# Patient Record
Sex: Female | Born: 1980 | Race: White | Hispanic: No | Marital: Married | Smoking: Never smoker
Health system: Southern US, Community
[De-identification: ages and names within clinical notes are randomized; demographics above are authoritative.]

## PROBLEM LIST (undated history)

## (undated) DIAGNOSIS — F329 Major depressive disorder, single episode, unspecified: Secondary | ICD-10-CM

## (undated) DIAGNOSIS — I499 Cardiac arrhythmia, unspecified: Secondary | ICD-10-CM

## (undated) DIAGNOSIS — F32A Depression, unspecified: Secondary | ICD-10-CM

## (undated) HISTORY — PX: WISDOM TOOTH EXTRACTION: SHX21

---

## 2003-02-19 ENCOUNTER — Emergency Department (HOSPITAL_COMMUNITY): Admission: EM | Admit: 2003-02-19 | Discharge: 2003-02-19 | Payer: Self-pay

## 2003-06-27 ENCOUNTER — Other Ambulatory Visit: Admission: RE | Admit: 2003-06-27 | Discharge: 2003-06-27 | Payer: Self-pay | Admitting: *Deleted

## 2004-06-29 ENCOUNTER — Other Ambulatory Visit: Admission: RE | Admit: 2004-06-29 | Discharge: 2004-06-29 | Payer: Self-pay | Admitting: *Deleted

## 2004-07-02 ENCOUNTER — Encounter: Admission: RE | Admit: 2004-07-02 | Discharge: 2004-07-02 | Payer: Self-pay | Admitting: Family Medicine

## 2005-07-13 ENCOUNTER — Other Ambulatory Visit: Admission: RE | Admit: 2005-07-13 | Discharge: 2005-07-13 | Payer: Self-pay | Admitting: *Deleted

## 2006-07-20 ENCOUNTER — Other Ambulatory Visit: Admission: RE | Admit: 2006-07-20 | Discharge: 2006-07-20 | Payer: Self-pay | Admitting: *Deleted

## 2007-08-17 ENCOUNTER — Other Ambulatory Visit: Admission: RE | Admit: 2007-08-17 | Discharge: 2007-08-17 | Payer: Self-pay | Admitting: Family Medicine

## 2008-08-19 ENCOUNTER — Other Ambulatory Visit: Admission: RE | Admit: 2008-08-19 | Discharge: 2008-08-19 | Payer: Self-pay | Admitting: Family Medicine

## 2009-09-03 ENCOUNTER — Other Ambulatory Visit: Admission: RE | Admit: 2009-09-03 | Discharge: 2009-09-03 | Payer: Self-pay | Admitting: Family Medicine

## 2010-12-03 ENCOUNTER — Other Ambulatory Visit: Payer: Self-pay | Admitting: Family Medicine

## 2010-12-03 DIAGNOSIS — R110 Nausea: Secondary | ICD-10-CM

## 2010-12-07 ENCOUNTER — Ambulatory Visit
Admission: RE | Admit: 2010-12-07 | Discharge: 2010-12-07 | Disposition: A | Discharge status: Home / Self Care | Payer: Commercial Managed Care - PPO | Source: Ambulatory Visit | Attending: Family Medicine | Admitting: Family Medicine

## 2010-12-07 DIAGNOSIS — R110 Nausea: Secondary | ICD-10-CM

## 2010-12-07 DIAGNOSIS — R7989 Other specified abnormal findings of blood chemistry: Secondary | ICD-10-CM

## 2010-12-17 ENCOUNTER — Other Ambulatory Visit (HOSPITAL_COMMUNITY): Payer: Self-pay | Admitting: Gastroenterology

## 2010-12-17 DIAGNOSIS — R110 Nausea: Secondary | ICD-10-CM

## 2010-12-22 ENCOUNTER — Encounter (HOSPITAL_COMMUNITY)
Admission: RE | Admit: 2010-12-22 | Discharge: 2010-12-22 | Disposition: A | Discharge status: Home / Self Care | Payer: Commercial Managed Care - PPO | Source: Ambulatory Visit | Attending: Gastroenterology | Admitting: Gastroenterology

## 2010-12-22 DIAGNOSIS — R110 Nausea: Secondary | ICD-10-CM | POA: Insufficient documentation

## 2010-12-22 MED ORDER — TECHNETIUM TC 99M MEBROFENIN IV KIT
5.0000 | PACK | Freq: Once | INTRAVENOUS | Status: AC | PRN
  Administered 2010-12-22: 5 via INTRAVENOUS

## 2010-12-29 ENCOUNTER — Other Ambulatory Visit (HOSPITAL_COMMUNITY): Payer: Commercial Managed Care - PPO

## 2011-06-08 NOTE — L&D Delivery Note (Signed)
Delivery Note At 7:23 PM a viable female was delivered via  (Presentation: ROA;  ).  APGAR:8/9 , ; weight .pending   Placenta status: intact, .3 vessel  Cord:  with the following complications:none .  Cord pH: na  Anesthesia:epidural   Episiotomy: none Lacerations: second degree Suture Repair: 2.0 chromic Est. Blood Loss (mL): 400 cc  Mom to postpartum.  Baby to nursery-stable.  Alastor Kneale S 05/16/2012, 7:39 PM

## 2011-10-04 LAB — OB RESULTS CONSOLE GC/CHLAMYDIA: Chlamydia: NEGATIVE

## 2011-10-04 LAB — OB RESULTS CONSOLE ANTIBODY SCREEN: Antibody Screen: NEGATIVE

## 2012-04-12 LAB — OB RESULTS CONSOLE GBS: GBS: NEGATIVE

## 2012-05-15 ENCOUNTER — Encounter (HOSPITAL_COMMUNITY): Payer: Self-pay | Admitting: *Deleted

## 2012-05-15 ENCOUNTER — Inpatient Hospital Stay (HOSPITAL_COMMUNITY)
Admission: AD | Admit: 2012-05-15 | Discharge: 2012-05-15 | Disposition: A | Discharge status: Home / Self Care | Payer: Commercial Managed Care - PPO | Source: Ambulatory Visit | Attending: Obstetrics and Gynecology | Admitting: Obstetrics and Gynecology

## 2012-05-15 DIAGNOSIS — O479 False labor, unspecified: Secondary | ICD-10-CM | POA: Insufficient documentation

## 2012-05-15 HISTORY — DX: Depression, unspecified: F32.A

## 2012-05-15 HISTORY — DX: Cardiac arrhythmia, unspecified: I49.9

## 2012-05-15 HISTORY — DX: Major depressive disorder, single episode, unspecified: F32.9

## 2012-05-15 LAB — COMPREHENSIVE METABOLIC PANEL
ALT: 11 U/L (ref 0–35)
AST: 19 U/L (ref 0–37)
Albumin: 3 g/dL — ABNORMAL LOW (ref 3.5–5.2)
Alkaline Phosphatase: 167 U/L — ABNORMAL HIGH (ref 39–117)
GFR calc Af Amer: >90 mL/min (ref >90)
Glucose, Bld: 91 mg/dL (ref 70–99)
Potassium: 4.2 mEq/L (ref 3.5–5.1)
Sodium: 135 mEq/L (ref 135–145)
Total Protein: 7 g/dL (ref 6.0–8.3)

## 2012-05-15 LAB — URINALYSIS, ROUTINE W REFLEX MICROSCOPIC
Ketones, ur: NEGATIVE mg/dL
Nitrite: NEGATIVE
Specific Gravity, Urine: <1.005 — ABNORMAL LOW (ref 1.005–1.030)
pH: 6 (ref 5.0–8.0)

## 2012-05-15 LAB — URINE MICROSCOPIC-ADD ON

## 2012-05-15 LAB — CBC
Hemoglobin: 12.1 g/dL (ref 12.0–15.0)
MCHC: 33.9 g/dL (ref 30.0–36.0)

## 2012-05-15 NOTE — MAU Note (Signed)
Contractions closer together

## 2012-05-15 NOTE — MAU Note (Signed)
uc's since 0200, becoming more intense & frequent, denies bleeding or LOF.

## 2012-05-15 NOTE — MAU Note (Signed)
Pt back from walking. To BR and then will reck cervix

## 2012-05-15 NOTE — Progress Notes (Signed)
Pt up to walk.

## 2012-05-15 NOTE — Progress Notes (Signed)
Pt very tense when breathing thru ctxs.. Encouraged to breathe and relax with ctxs.

## 2012-05-15 NOTE — Progress Notes (Signed)
Written and verbal d/c instructions given and understanding voiced. 

## 2012-05-15 NOTE — Progress Notes (Signed)
Dr Marcelle Overlie notified of pt's lab results and unchanged sve. Aware CNM saw pt and reviewed labs and efm. Pt stable for dc home

## 2012-05-15 NOTE — Progress Notes (Signed)
Dr Marcelle Overlie notified of pt's admission and status. Aware of ctx pattern, sve, elevated B/P. Orders received.

## 2012-05-16 ENCOUNTER — Encounter (HOSPITAL_COMMUNITY): Payer: Self-pay | Admitting: Anesthesiology

## 2012-05-16 ENCOUNTER — Inpatient Hospital Stay (HOSPITAL_COMMUNITY): Payer: Commercial Managed Care - PPO | Admitting: Anesthesiology

## 2012-05-16 ENCOUNTER — Encounter (HOSPITAL_COMMUNITY): Payer: Self-pay | Admitting: Obstetrics

## 2012-05-16 ENCOUNTER — Inpatient Hospital Stay (HOSPITAL_COMMUNITY)
Admission: AD | Admit: 2012-05-16 | Discharge: 2012-05-18 | DRG: 775 | Disposition: A | Discharge status: Home / Self Care | Payer: Commercial Managed Care - PPO | Source: Ambulatory Visit | Attending: Obstetrics and Gynecology | Admitting: Obstetrics and Gynecology

## 2012-05-16 LAB — CBC
HCT: 35 % — ABNORMAL LOW (ref 36.0–46.0)
Hemoglobin: 11.7 g/dL — ABNORMAL LOW (ref 12.0–15.0)
MCH: 31.5 pg (ref 26.0–34.0)
MCHC: 33.4 g/dL (ref 30.0–36.0)
MCV: 94.1 fL (ref 78.0–100.0)
RDW: 13.1 % (ref 11.5–15.5)

## 2012-05-16 LAB — POCT FERN TEST: POCT Fern Test: NEGATIVE

## 2012-05-16 MED ORDER — PHENYLEPHRINE 40 MCG/ML (10ML) SYRINGE FOR IV PUSH (FOR BLOOD PRESSURE SUPPORT)
80.0000 ug | PREFILLED_SYRINGE | INTRAVENOUS | Status: DC | PRN
  Filled 2012-05-16: qty 5

## 2012-05-16 MED ORDER — LIDOCAINE HCL (PF) 1 % IJ SOLN
30.0000 mL | INTRAMUSCULAR | Status: DC | PRN
  Administered 2012-05-16: 30 mL via SUBCUTANEOUS
  Filled 2012-05-16: qty 30

## 2012-05-16 MED ORDER — OXYTOCIN 40 UNITS IN LACTATED RINGERS INFUSION - SIMPLE MED: 62.5000 mL/h | INTRAVENOUS | Status: DC

## 2012-05-16 MED ORDER — OXYTOCIN 40 UNITS IN LACTATED RINGERS INFUSION - SIMPLE MED
1.0000 m[IU]/min | INTRAVENOUS | Status: DC
  Administered 2012-05-16: 2 m[IU]/min via INTRAVENOUS
  Filled 2012-05-16: qty 1000

## 2012-05-16 MED ORDER — FENTANYL 2.5 MCG/ML BUPIVACAINE 1/10 % EPIDURAL INFUSION (WH - ANES)
INTRAMUSCULAR | Status: DC | PRN
  Administered 2012-05-16: 14 mL/h via EPIDURAL

## 2012-05-16 MED ORDER — DIPHENHYDRAMINE HCL 50 MG/ML IJ SOLN: 12.5000 mg | INTRAMUSCULAR | Status: DC | PRN

## 2012-05-16 MED ORDER — SENNOSIDES-DOCUSATE SODIUM 8.6-50 MG PO TABS
2.0000 | ORAL_TABLET | Freq: Every day | ORAL | Status: DC
  Administered 2012-05-16 – 2012-05-17 (×2): 2 via ORAL

## 2012-05-16 MED ORDER — TETANUS-DIPHTH-ACELL PERTUSSIS 5-2.5-18.5 LF-MCG/0.5 IM SUSP: 0.5000 mL | Freq: Once | INTRAMUSCULAR | Status: DC

## 2012-05-16 MED ORDER — ACETAMINOPHEN 325 MG PO TABS: 650.0000 mg | ORAL_TABLET | ORAL | Status: DC | PRN

## 2012-05-16 MED ORDER — TERBUTALINE SULFATE 1 MG/ML IJ SOLN: 0.2500 mg | Freq: Once | INTRAMUSCULAR | Status: DC | PRN

## 2012-05-16 MED ORDER — PRENATAL MULTIVITAMIN CH
1.0000 | ORAL_TABLET | Freq: Every day | ORAL | Status: DC
  Administered 2012-05-17 – 2012-05-18 (×2): 1 via ORAL
  Filled 2012-05-16 (×2): qty 1

## 2012-05-16 MED ORDER — LACTATED RINGERS IV SOLN: 500.0000 mL | Freq: Once | INTRAVENOUS | Status: DC

## 2012-05-16 MED ORDER — BENZOCAINE-MENTHOL 20-0.5 % EX AERO
1.0000 "application " | INHALATION_SPRAY | CUTANEOUS | Status: DC | PRN
  Filled 2012-05-16: qty 56

## 2012-05-16 MED ORDER — OXYCODONE-ACETAMINOPHEN 5-325 MG PO TABS
1.0000 | ORAL_TABLET | ORAL | Status: DC | PRN
  Administered 2012-05-17: 2 via ORAL
  Administered 2012-05-17 (×2): 1 via ORAL
  Administered 2012-05-18: 2 via ORAL
  Filled 2012-05-16 (×2): qty 1
  Filled 2012-05-16 (×2): qty 2

## 2012-05-16 MED ORDER — WITCH HAZEL-GLYCERIN EX PADS: 1.0000 "application " | MEDICATED_PAD | CUTANEOUS | Status: DC | PRN

## 2012-05-16 MED ORDER — OXYTOCIN BOLUS FROM INFUSION
500.0000 mL | INTRAVENOUS | Status: DC
  Administered 2012-05-16: 500 mL via INTRAVENOUS

## 2012-05-16 MED ORDER — OXYCODONE-ACETAMINOPHEN 5-325 MG PO TABS: 1.0000 | ORAL_TABLET | ORAL | Status: DC | PRN

## 2012-05-16 MED ORDER — ONDANSETRON HCL 4 MG/2ML IJ SOLN: 4.0000 mg | Freq: Four times a day (QID) | INTRAMUSCULAR | Status: DC | PRN

## 2012-05-16 MED ORDER — LIDOCAINE HCL (PF) 1 % IJ SOLN
INTRAMUSCULAR | Status: DC | PRN
  Administered 2012-05-16 (×2): 4 mL

## 2012-05-16 MED ORDER — LACTATED RINGERS IV SOLN
INTRAVENOUS | Status: DC
  Administered 2012-05-16: 17:00:00 via INTRAVENOUS

## 2012-05-16 MED ORDER — IBUPROFEN 600 MG PO TABS
600.0000 mg | ORAL_TABLET | Freq: Four times a day (QID) | ORAL | Status: DC | PRN
  Administered 2012-05-16: 600 mg via ORAL
  Filled 2012-05-16: qty 1

## 2012-05-16 MED ORDER — LACTATED RINGERS IV SOLN: 500.0000 mL | INTRAVENOUS | Status: DC | PRN

## 2012-05-16 MED ORDER — LANOLIN HYDROUS EX OINT: TOPICAL_OINTMENT | CUTANEOUS | Status: DC | PRN

## 2012-05-16 MED ORDER — IBUPROFEN 600 MG PO TABS
600.0000 mg | ORAL_TABLET | Freq: Four times a day (QID) | ORAL | Status: DC
  Administered 2012-05-17 – 2012-05-18 (×6): 600 mg via ORAL
  Filled 2012-05-16 (×7): qty 1

## 2012-05-16 MED ORDER — BISACODYL 10 MG RE SUPP: 10.0000 mg | Freq: Every day | RECTAL | Status: DC | PRN

## 2012-05-16 MED ORDER — ZOLPIDEM TARTRATE 5 MG PO TABS: 5.0000 mg | ORAL_TABLET | Freq: Every evening | ORAL | Status: DC | PRN

## 2012-05-16 MED ORDER — EPHEDRINE 5 MG/ML INJ: 10.0000 mg | INTRAVENOUS | Status: DC | PRN

## 2012-05-16 MED ORDER — DIPHENHYDRAMINE HCL 25 MG PO CAPS: 25.0000 mg | ORAL_CAPSULE | Freq: Four times a day (QID) | ORAL | Status: DC | PRN

## 2012-05-16 MED ORDER — ONDANSETRON HCL 4 MG/2ML IJ SOLN: 4.0000 mg | INTRAMUSCULAR | Status: DC | PRN

## 2012-05-16 MED ORDER — FLEET ENEMA 7-19 GM/118ML RE ENEM: 1.0000 | ENEMA | RECTAL | Status: DC | PRN

## 2012-05-16 MED ORDER — DIBUCAINE 1 % RE OINT: 1.0000 "application " | TOPICAL_OINTMENT | RECTAL | Status: DC | PRN

## 2012-05-16 MED ORDER — SIMETHICONE 80 MG PO CHEW: 80.0000 mg | CHEWABLE_TABLET | ORAL | Status: DC | PRN

## 2012-05-16 MED ORDER — FENTANYL 2.5 MCG/ML BUPIVACAINE 1/10 % EPIDURAL INFUSION (WH - ANES)
14.0000 mL/h | INTRAMUSCULAR | Status: DC
  Administered 2012-05-16 (×2): 14 mL/h via EPIDURAL
  Filled 2012-05-16 (×3): qty 125

## 2012-05-16 MED ORDER — FLEET ENEMA 7-19 GM/118ML RE ENEM: 1.0000 | ENEMA | Freq: Every day | RECTAL | Status: DC | PRN

## 2012-05-16 MED ORDER — EPHEDRINE 5 MG/ML INJ
10.0000 mg | INTRAVENOUS | Status: DC | PRN
  Filled 2012-05-16: qty 4

## 2012-05-16 MED ORDER — CITRIC ACID-SODIUM CITRATE 334-500 MG/5ML PO SOLN: 30.0000 mL | ORAL | Status: DC | PRN

## 2012-05-16 MED ORDER — ONDANSETRON HCL 4 MG PO TABS: 4.0000 mg | ORAL_TABLET | ORAL | Status: DC | PRN

## 2012-05-16 MED ORDER — PHENYLEPHRINE 40 MCG/ML (10ML) SYRINGE FOR IV PUSH (FOR BLOOD PRESSURE SUPPORT): 80.0000 ug | PREFILLED_SYRINGE | INTRAVENOUS | Status: DC | PRN

## 2012-05-16 NOTE — Anesthesia Preprocedure Evaluation (Addendum)
Anesthesia Evaluation  Patient identified by MRN, date of birth, ID band Patient awake    Reviewed: Allergy & Precautions, H&P , Patient's Chart, lab work & pertinent test results  Airway Mallampati: III TM Distance: >3 FB Neck ROM: full    Dental No notable dental hx. (+) Teeth Intact   Pulmonary neg pulmonary ROS,  breath sounds clear to auscultation  Pulmonary exam normal       Cardiovascular negative cardio ROS  + dysrhythmias Rhythm:regular Rate:Normal     Neuro/Psych PSYCHIATRIC DISORDERS Depression negative neurological ROS     GI/Hepatic negative GI ROS, Neg liver ROS,   Endo/Other  negative endocrine ROS  Renal/GU negative Renal ROS  negative genitourinary   Musculoskeletal   Abdominal Normal abdominal exam  (+)   Peds  Hematology negative hematology ROS (+)   Anesthesia Other Findings   Reproductive/Obstetrics (+) Pregnancy                           Anesthesia Physical Anesthesia Plan  ASA: II  Anesthesia Plan: Epidural   Post-op Pain Management:    Induction:   Airway Management Planned:   Additional Equipment:   Intra-op Plan:   Post-operative Plan:   Informed Consent: I have reviewed the patients History and Physical, chart, labs and discussed the procedure including the risks, benefits and alternatives for the proposed anesthesia with the patient or authorized representative who has indicated his/her understanding and acceptance.     Plan Discussed with: Anesthesiologist  Anesthesia Plan Comments:         Anesthesia Quick Evaluation

## 2012-05-16 NOTE — MAU Note (Signed)
`  Pt states her water beroke at 12 midnight

## 2012-05-16 NOTE — Progress Notes (Signed)
05/16/12 1100  Clinical Encounter Type  Visited With Patient and family together (Husband Kathlene November)  Visit Type Initial;Spiritual support;Social support  Spiritual Encounters  Spiritual Needs Emotional    Made initial visit to offer spiritual and emotional support.  Per pt, her parents work across the street and plan to come when pushing begins in order to be present upon delivery.  Couple reports good preparedness and support.  Provided pastoral presence and listening.  Family aware of ongoing chaplain availability.  [address] [place], [place] [phone]

## 2012-05-16 NOTE — H&P (Signed)
Martha Elliott is a 31 y.o. female presenting with SROM at 39.6.  Uncomplicated prenatal course.  Negative GBS+ Maternal Medical History:  Reason for admission: Reason for admission: rupture of membranes.  Contractions: Onset was 3-5 hours ago.   Frequency: regular.   Perceived severity is moderate.    Fetal activity: Perceived fetal activity is normal.    Prenatal Complications - Diabetes: none.    OB History    Grav Para Term Preterm Abortions TAB SAB Ect Mult Living   1 0 0 0 0 0 0 0 0 0      Past Medical History  Diagnosis Date  . Depression   . Dysrhythmia     PVC's, sees cardiologist   Past Surgical History  Procedure Date  . Wisdom tooth extraction    Family History: family history is negative for Other. Social History:  reports that she has never smoked. She does not have any smokeless tobacco history on file. She reports that she does not drink alcohol or use illicit drugs.   Prenatal Transfer Tool  Maternal Diabetes: No Genetic Screening: Normal Maternal Ultrasounds/Referrals: Normal Fetal Ultrasounds or other Referrals:  None Maternal Substance Abuse:  No Significant Maternal Medications:  None Significant Maternal Lab Results:  None Other Comments:  None  ROS  Dilation: 6 Effacement (%): 100 Station: -1 Exam by:: PTeena Dunk RNC Blood pressure 118/70, pulse 98, temperature 98.1 F (36.7 C), temperature source Oral, resp. rate 24, height 5' (1.524 m), weight 80.287 kg (177 lb), SpO2 99.00%. Maternal Exam:  Uterine Assessment: Contraction strength is moderate.  Contraction frequency is regular.   Abdomen: Patient reports no abdominal tenderness. Fundal height is c/w dates.   Estimated fetal weight is 7.   Fetal presentation: vertex  Introitus: Normal vulva. Amniotic fluid character: clear.  Pelvis: adequate for delivery.   Cervix: Cervix 6 cm 100 % and vtx -1  Physical Exam  Prenatal labs: ABO, Rh: O/Positive/-- (04/29 0000) Antibody: Negative  (04/29 0000) Rubella: Immune (04/29 0000) RPR: Nonreactive (04/29 0000)  HBsAg: Negative (04/29 0000)  HIV: Non-reactive (04/29 0000)  GBS: Negative (11/06 0000)   Assessment/Plan:Intauterine pregnancy at 39.6 with SROM Routine labor and delivery   Deundre Thong S 05/16/2012, 7:58 AM

## 2012-05-16 NOTE — Anesthesia Procedure Notes (Addendum)
Epidural Patient location during procedure: OB Start time: 05/16/2012 4:19 AM  Staffing Anesthesiologist: Dalilah Curlin A. Performed by: anesthesiologist   Preanesthetic Checklist Completed: patient identified, site marked, surgical consent, pre-op evaluation, timeout performed, IV checked, risks and benefits discussed and monitors and equipment checked  Epidural Patient position: sitting Prep: site prepped and draped and DuraPrep Patient monitoring: continuous pulse ox and blood pressure Approach: midline Injection technique: LOR air  Needle:  Needle type: Tuohy  Needle gauge: 17 G Needle length: 9 cm and 9 Needle insertion depth: 5 cm cm Catheter type: closed end flexible Catheter size: 19 Gauge Catheter at skin depth: 10 cm Test dose: negative and Other  Assessment Events: blood not aspirated, injection not painful, no injection resistance, negative IV test and no paresthesia  Additional Notes Patient identified. Risks and benefits discussed including failed block, incomplete  Pain control, post dural puncture headache, nerve damage, paralysis, blood pressure Changes, nausea, vomiting, reactions to medications-both toxic and allergic and post Partum back pain. All questions were answered. Patient expressed understanding and wished to proceed. Sterile technique was used throughout procedure. Epidural site was Dressed with sterile barrier dressing. No paresthesias, signs of intravascular injection.  Or signs of intrathecal spread were encountered. Attempts x2. Somewhat difficult due to scoliosis. Patient was more comfortable after the epidural was dosed. Please see RN's note for documentation of vital signs and FHR which are stable.

## 2012-05-16 NOTE — Progress Notes (Signed)
Patient is fearful of pushing;  Will pcea and allow time to feel more comfortable with pushing process

## 2012-05-17 LAB — COMPREHENSIVE METABOLIC PANEL
AST: 37 U/L (ref 0–37)
Albumin: 2.1 g/dL — ABNORMAL LOW (ref 3.5–5.2)
Alkaline Phosphatase: 124 U/L — ABNORMAL HIGH (ref 39–117)
BUN: 15 mg/dL (ref 6–23)
CO2: 22 mEq/L (ref 19–32)
Chloride: 105 mEq/L (ref 96–112)
Creatinine, Ser: 1.06 mg/dL (ref 0.50–1.10)
GFR calc non Af Amer: 69 mL/min — ABNORMAL LOW (ref >90)
Potassium: 3.9 mEq/L (ref 3.5–5.1)
Total Bilirubin: 0.1 mg/dL — ABNORMAL LOW (ref 0.3–1.2)

## 2012-05-17 LAB — CBC
HCT: 26.9 % — ABNORMAL LOW (ref 36.0–46.0)
Hemoglobin: 8.9 g/dL — ABNORMAL LOW (ref 12.0–15.0)
MCV: 94.4 fL (ref 78.0–100.0)
Platelets: 183 10*3/uL (ref 150–400)
RBC: 2.83 MIL/uL — ABNORMAL LOW (ref 3.87–5.11)
RBC: 2.85 MIL/uL — ABNORMAL LOW (ref 3.87–5.11)
WBC: 18.6 10*3/uL — ABNORMAL HIGH (ref 4.0–10.5)
WBC: 18.8 10*3/uL — ABNORMAL HIGH (ref 4.0–10.5)

## 2012-05-17 NOTE — Progress Notes (Signed)
Post Partum Day 1 Subjective: no complaints, up ad lib, voiding, tolerating PO and + flatus  Objective: Blood pressure 117/83, pulse 78, temperature 97.9 F (36.6 C), temperature source Oral, resp. rate 20, height 5' (1.524 m), weight 80.287 kg (177 lb), SpO2 99.00%, unknown if currently breastfeeding.  Physical Exam:  General: alert and cooperative Lochia: appropriate Uterine Fundus: firm Incision: perineum intact DVT Evaluation: No evidence of DVT seen on physical exam. No significant calf/ankle edema.   Basename 05/17/12 0535 05/16/12 0300  HGB 8.9* 11.7*  HCT 26.7* 35.0*    Assessment/Plan: Plan for discharge tomorrow   LOS: 1 day   Decoda Van G 05/17/2012, 8:12 AM

## 2012-05-17 NOTE — Progress Notes (Signed)
Patient was referred for history of depression/anxiety.  * Referral screened out by Clinical Social Worker because none of the following criteria appear to apply:  ~ History of anxiety/depression during this pregnancy, or of post-partum depression.  ~ Diagnosis of anxiety and/or depression within last 3 years  ~ History of depression due to pregnancy loss/loss of child  OR  * Patient's symptoms currently being treated with medication and/or therapy.  Please contact the Clinical Social Worker if needs arise, or by the patient's request.  Pt is seen by Dr. Evelene Croon and has prescription for Zoloft, if needed.

## 2012-05-17 NOTE — Progress Notes (Signed)
05/17/12 1300  Clinical Encounter Type  Visited With Patient and family together (Husband Casimiro Needle)  Visit Type Follow-up;Spiritual support;Social support  Spiritual Encounters  Spiritual Needs Emotional    Made brief follow-up visit to offer congratulations and blessings.  Family is overall happy and grateful, though Martha Elliott is physically uncomfortable and worn out from three hours of pushing ("It was a looooonnng day," per pt).  [address] [place], [place] [phone]

## 2012-05-17 NOTE — Anesthesia Postprocedure Evaluation (Signed)
  Anesthesia Post-op Note  Patient: Martha Elliott  Procedure(s) Performed: * No procedures listed *  Patient Location: PACU and Mother/Baby  Anesthesia Type:Epidural  Level of Consciousness: awake, alert , oriented and patient cooperative  Airway and Oxygen Therapy: Patient Spontanous Breathing  Post-op Pain: mild  Post-op Assessment: Patient's Cardiovascular Status Stable, Respiratory Function Stable, Patent Airway, No signs of Nausea or vomiting and Adequate PO intake  Post-op Vital Signs: Reviewed and stable  Complications: No apparent anesthesia complications

## 2012-05-18 MED ORDER — IBUPROFEN 600 MG PO TABS: 600.0000 mg | ORAL_TABLET | Freq: Four times a day (QID) | ORAL | Status: AC

## 2012-05-18 MED ORDER — OXYCODONE-ACETAMINOPHEN 5-325 MG PO TABS: 1.0000 | ORAL_TABLET | ORAL | Status: AC | PRN

## 2012-05-18 NOTE — Progress Notes (Signed)
Post Partum Day 2 Subjective: no complaints, up ad lib, voiding, tolerating PO and denies HA, blurred vision, or RUQ pain  Objective: Blood pressure 132/91, pulse 16, temperature 98.5 F (36.9 C), temperature source Oral, resp. rate 20, height 5' (1.524 m), weight 80.287 kg (177 lb), SpO2 99.00%, unknown if currently breastfeeding.  Physical Exam:  General: alert and cooperative Lochia: appropriate Uterine Fundus: firm Incision: perineum intact DVT Evaluation: No evidence of DVT seen on physical exam. No significant calf/ankle edema. DTR's 2- 3+ no clonus   Basename 05/17/12 2320 05/17/12 0535  HGB 8.9* 8.9*  HCT 26.9* 26.7*  LFT's WNL  Assessment/Plan: Discharge home RTO 2-3 days for bp check.  Reviewed Signs and symptoms of PIH with patient and spouse   LOS: 2 days   Madeleyn Schwimmer G 05/18/2012, 9:08 AM

## 2012-05-18 NOTE — Discharge Summary (Signed)
Obstetric Discharge Summary Reason for Admission: rupture of membranes Prenatal Procedures: ultrasound Intrapartum Procedures: spontaneous vaginal delivery Postpartum Procedures: none Complications-Operative and Postpartum: 2 degree perineal laceration Hemoglobin  Date Value Range Status  05/17/2012 8.9* 12.0 - 15.0 g/dL Final     HCT  Date Value Range Status  05/17/2012 26.9* 36.0 - 46.0 % Final    Physical Exam:  General: alert and cooperative Lochia: appropriate Uterine Fundus: firm Incision: perineum intact DVT Evaluation: No evidence of DVT seen on physical exam. No significant calf/ankle edema.  Discharge Diagnoses: Term Pregnancy-delivered  Discharge Information: Date: 05/18/2012 Activity: pelvic rest Diet: routine Medications: PNV, Ibuprofen and Percocet Condition: stable Instructions: refer to practice specific booklet Discharge to: home   Newborn Data: Live born female  Birth Weight: 6 lb 12.8 oz (3084 g) APGAR: 8, 9  Home with mother.  Martha Elliott G 05/18/2012, 9:28 AM

## 2012-05-22 ENCOUNTER — Telehealth (HOSPITAL_COMMUNITY): Payer: Self-pay | Admitting: *Deleted

## 2012-05-22 NOTE — Telephone Encounter (Signed)
Resolve episode 

## 2012-08-02 ENCOUNTER — Ambulatory Visit (HOSPITAL_COMMUNITY)
Admission: RE | Admit: 2012-08-02 | Discharge: 2012-08-02 | Disposition: A | Discharge status: Home / Self Care | Payer: Commercial Managed Care - PPO | Source: Ambulatory Visit | Attending: Obstetrics and Gynecology | Admitting: Obstetrics and Gynecology

## 2012-08-02 NOTE — Lactation Note (Signed)
Adult Lactation Consultation Outpatient Visit Note  Patient Name: Martha Elliott                                     Baby Girl Neisha Hinger, DOB [birth year], birth weight 6 lb. 12 oz. Date of Birth: Nov 22, 1980                                                   Now approx 68+ weeks old Gestational Age at Delivery: Unknown Type of Delivery: SVB  Breastfeeding History: Frequency of Breastfeeding: every 2 hours during the day, sleeps thru the night, on average 9 hours Length of Feeding: 10-40 minutes Voids:  Stools:   Supplementing / Method: Pumping:  Type of Pump:  Medela, Pump N Style, Harmony Hand Pump   Frequency:  Prn at night or 1st thing in the morning.  Volume:  1st morning pump 10-12 oz.    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 She is here for feeding assessment to see how much milk Denny Peon transfers at the breast. When she takes a bottle at home she usually takes 4 oz unless it is the 1st feeding after sleeping thru the night then she will take up to 6 oz. Mom also wanted LC to look at left nipple. Mom reports a "blood blister" that appears after she pumps. She reports it is not painful and does not interfere with breastfeeding. She reports it becomes more prominent after pumping but does not change with breastfeeding.    Consultation Evaluation: On left nipple, pin sized area at 12:00 at the base of the left nipple appears to be a small blood blister. No bleeding or discomfort with the baby at the breast and Denny Peon transfers milk well from this breast. On the underside of the left nipple is a dark area which appears to be scar tissue where Mom reports having a crack the 1st week after delivery. Mom reports this does not cause discomfort or interfere with breastfeeding either. Denny Peon latches well, did adjust [place] on the left nipple to keep her lips flanged well, widen her mouth for deeper latch. On the  right breast, Denny Peon latched well, she did begin to pull on and off after breastfeeding for 5-10 minutes. She passed gas, Mom burped Denny Peon and she spit up a small amount. Mom reports Denny Peon does spit up with some feedings but is not with every feeding and has not affected her weight. Advised Mom when [place] pulls on and off the breast it could be due to fast milk ejection, gas. Mom reports she is burping the baby frequently and keeps her upright after feedings for 10-15 minutes.   Initial Feeding Assessment: Pre-feed Weight:  11 lb. 5.6 oz/5150 gm Post-feed Weight:  11 lb. 7.4 oz/5198 gm Amount Transferred:  48 ml Comments:  From right breast with breastfeeding for 10 minutes  Additional Feeding Assessment: Pre-feed Weight:  11 lb. 7.4 oz/5198 gm Post-feed Weight:  11 lb. 9.1 oz/5248 gm Amount Transferred:  50 ml Comments:  From the left breast with breastfeeding for 10 minutes  Additional Feeding Assessment: Pre-feed Weight: Post-feed Weight: Amount Transferred: Comments:  Total Breast milk  Transferred this Visit: 98 ml Total Supplement Given: None  Additional Interventions: No interventions needed for Mom. Denny Peon is currently taking 4 oz from a bottle when supplemented and advised Mom if she pumps enough to store for [facility] to have 4 oz each feeding should be enough for now. Mom will be working 8+ hours per day and advised to have enough bottles for 3-4 feedings. Mom will need to pump at work at least 3 times per day. Advised to turn down her pump when pumping on the left breast if this is making the small blood blister become larger. Unless it becomes painful or interferes with breastfeeding, Mom plans to leave this alone.   Follow-Up  Prn. Support group, prn.    Alfred Levins 08/02/2012, 2:44 PM

## 2014-04-08 ENCOUNTER — Encounter (HOSPITAL_COMMUNITY): Payer: Self-pay | Admitting: Obstetrics

## 2014-07-22 ENCOUNTER — Other Ambulatory Visit: Payer: Self-pay | Admitting: Obstetrics and Gynecology

## 2014-07-23 LAB — CYTOLOGY - PAP

## 2014-12-22 ENCOUNTER — Emergency Department (HOSPITAL_COMMUNITY)
Admission: EM | Admit: 2014-12-22 | Discharge: 2014-12-22 | Disposition: A | Discharge status: Home / Self Care | Payer: Commercial Managed Care - PPO | Source: Home / Self Care | Attending: Family Medicine | Admitting: Family Medicine

## 2014-12-22 ENCOUNTER — Encounter (HOSPITAL_COMMUNITY): Payer: Self-pay | Admitting: Emergency Medicine

## 2014-12-22 DIAGNOSIS — A09 Infectious gastroenteritis and colitis, unspecified: Secondary | ICD-10-CM | POA: Diagnosis not present

## 2014-12-22 MED ORDER — CIPROFLOXACIN HCL 500 MG PO TABS: 500.0000 mg | ORAL_TABLET | Freq: Two times a day (BID) | ORAL | Status: AC

## 2014-12-22 NOTE — ED Provider Notes (Signed)
CSN: [id number]     Arrival date & time 12/22/14  1845 History   First MD Initiated Contact with Patient 12/22/14 1900     Chief Complaint  Patient presents with  . Diarrhea   (Consider location/radiation/quality/duration/timing/severity/associated sxs/prior Treatment) Patient is a 34 y.o. female presenting with diarrhea. The history is provided by the patient.  Diarrhea Quality:  Watery Severity:  Moderate Onset quality:  Sudden Number of episodes:  6 Duration:  4 hours Progression:  Unchanged Associated symptoms: no abdominal pain, no fever and no vomiting   Risk factors: travel to endemic area   Risk factors comment:  Just back last eve from trip to [place] stayed in resort.   Past Medical History  Diagnosis Date  . Depression   . Dysrhythmia     PVC's, sees cardiologist   Past Surgical History  Procedure Laterality Date  . Wisdom tooth extraction     Family History  Problem Relation Age of Onset  . Other Neg Hx    History  Substance Use Topics  . Smoking status: Never Smoker   . Smokeless tobacco: Not on file  . Alcohol Use: No   OB History    Gravida Para Term Preterm AB TAB SAB Ectopic Multiple Living   1 1 1  0 0 0 0 0 0 1     Review of Systems  Constitutional: Negative for fever.  Gastrointestinal: Positive for diarrhea. Negative for nausea, vomiting, abdominal pain and blood in stool.    Allergies  Ceclor and Erythromycin  Home Medications   Prior to Admission medications   Medication Sig Start Date End Date Taking? Authorizing Provider  loratadine (CLARITIN) 10 MG tablet Take 10 mg by mouth daily.   Yes Historical Provider, MD  Norethin Ace-Eth Estrad-FE (LOMEDIA 24 FE PO) Take by mouth.   Yes Historical Provider, MD  Sertraline HCl (ZOLOFT PO) Take 75 mg by mouth.   Yes Historical Provider, MD  ciprofloxacin (CIPRO) 500 MG tablet Take 1 tablet (500 mg total) by mouth 2 (two) times daily. 12/22/14   Billy Fischer, MD  ibuprofen (ADVIL,MOTRIN) 600 MG  tablet Take 1 tablet (600 mg total) by mouth every 6 (six) hours. 05/18/12   Juanda Chance, NP  oxyCODONE-acetaminophen (PERCOCET/ROXICET) 5-325 MG per tablet Take 1-2 tablets by mouth every 4 (four) hours as needed (moderate - severe pain). 05/18/12   Juanda Chance, NP  Prenatal Vit-Fe Fumarate-FA (PRENATAL MULTIVITAMIN) TABS Take 1 tablet by mouth daily.    Historical Provider, MD   BP 156/108 mmHg  Pulse 90  Temp(Src) 98.1 F (36.7 C) (Oral)  Resp 18  SpO2 98%  LMP 12/01/2014 Physical Exam  Constitutional: She is oriented to person, place, and time. She appears well-developed and well-nourished. No distress.  Neck: Normal range of motion. Neck supple.  Cardiovascular: Regular rhythm, normal heart sounds and intact distal pulses.   Abdominal: Soft. Bowel sounds are normal. She exhibits no distension and no mass. There is no tenderness. There is no rebound and no guarding.  Lymphadenopathy:    She has no cervical adenopathy.  Neurological: She is alert and oriented to person, place, and time.  Skin: Skin is warm and dry.  Nursing note and vitals reviewed.   ED Course  Procedures (including critical care time) Labs Review Labs Reviewed - No data to display  Imaging Review No results found.   MDM   1. Traveler's diarrhea        Billy Fischer, MD 12/22/14 [phone]

## 2014-12-22 NOTE — ED Notes (Signed)
Patient returned from [place] yesterday.  Today around 3:00 pm started having frequent diarrhea stools.  Reports 7-8 stools since onset.  No blood in stool, no nausea, no vomiting, no fever. Patient has been out of the country for one week and limited trip to a resort.  Marland Kitchen

## 2014-12-22 NOTE — ED Notes (Signed)
Discharge delayed secondary to patient in bathroom

## 2014-12-22 NOTE — Discharge Instructions (Signed)
Clear liquids as discussed, take all of medicine, use probiotic daily, return or see your doctor if fever, blood or vomiting develop.

## 2016-06-11 DIAGNOSIS — R6883 Chills (without fever): Secondary | ICD-10-CM | POA: Diagnosis not present

## 2016-07-20 DIAGNOSIS — R05 Cough: Secondary | ICD-10-CM | POA: Diagnosis not present

## 2016-08-25 DIAGNOSIS — Z01419 Encounter for gynecological examination (general) (routine) without abnormal findings: Secondary | ICD-10-CM | POA: Diagnosis not present

## 2016-10-18 DIAGNOSIS — Z0000 Encounter for general adult medical examination without abnormal findings: Secondary | ICD-10-CM | POA: Diagnosis not present

## 2017-01-04 DIAGNOSIS — R0981 Nasal congestion: Secondary | ICD-10-CM | POA: Diagnosis not present

## 2017-03-22 DIAGNOSIS — H5213 Myopia, bilateral: Secondary | ICD-10-CM | POA: Diagnosis not present

## 2017-03-30 DIAGNOSIS — L239 Allergic contact dermatitis, unspecified cause: Secondary | ICD-10-CM | POA: Diagnosis not present

## 2017-03-30 DIAGNOSIS — B078 Other viral warts: Secondary | ICD-10-CM | POA: Diagnosis not present

## 2017-03-30 DIAGNOSIS — D1801 Hemangioma of skin and subcutaneous tissue: Secondary | ICD-10-CM | POA: Diagnosis not present

## 2017-08-02 DIAGNOSIS — B078 Other viral warts: Secondary | ICD-10-CM | POA: Diagnosis not present

## 2017-08-02 DIAGNOSIS — L218 Other seborrheic dermatitis: Secondary | ICD-10-CM | POA: Diagnosis not present

## 2017-09-06 DIAGNOSIS — B079 Viral wart, unspecified: Secondary | ICD-10-CM | POA: Diagnosis not present

## 2017-09-27 DIAGNOSIS — Z01419 Encounter for gynecological examination (general) (routine) without abnormal findings: Secondary | ICD-10-CM | POA: Diagnosis not present

## 2017-09-27 DIAGNOSIS — Z6830 Body mass index (BMI) 30.0-30.9, adult: Secondary | ICD-10-CM | POA: Diagnosis not present

## 2017-10-06 DIAGNOSIS — B078 Other viral warts: Secondary | ICD-10-CM | POA: Diagnosis not present

## 2017-10-07 DIAGNOSIS — N926 Irregular menstruation, unspecified: Secondary | ICD-10-CM | POA: Diagnosis not present

## 2017-10-07 DIAGNOSIS — N921 Excessive and frequent menstruation with irregular cycle: Secondary | ICD-10-CM | POA: Diagnosis not present

## 2017-10-20 DIAGNOSIS — Z0000 Encounter for general adult medical examination without abnormal findings: Secondary | ICD-10-CM | POA: Diagnosis not present

## 2017-10-20 DIAGNOSIS — K589 Irritable bowel syndrome without diarrhea: Secondary | ICD-10-CM | POA: Diagnosis not present

## 2017-10-20 DIAGNOSIS — R5383 Other fatigue: Secondary | ICD-10-CM | POA: Diagnosis not present

## 2017-11-07 DIAGNOSIS — B078 Other viral warts: Secondary | ICD-10-CM | POA: Diagnosis not present

## 2017-12-07 DIAGNOSIS — B078 Other viral warts: Secondary | ICD-10-CM | POA: Diagnosis not present

## 2018-01-30 DIAGNOSIS — B078 Other viral warts: Secondary | ICD-10-CM | POA: Diagnosis not present

## 2018-03-03 DIAGNOSIS — J0100 Acute maxillary sinusitis, unspecified: Secondary | ICD-10-CM | POA: Diagnosis not present

## 2018-03-16 DIAGNOSIS — B078 Other viral warts: Secondary | ICD-10-CM | POA: Diagnosis not present

## 2018-04-17 DIAGNOSIS — L728 Other follicular cysts of the skin and subcutaneous tissue: Secondary | ICD-10-CM | POA: Diagnosis not present

## 2018-04-17 DIAGNOSIS — D225 Melanocytic nevi of trunk: Secondary | ICD-10-CM | POA: Diagnosis not present

## 2018-04-17 DIAGNOSIS — L821 Other seborrheic keratosis: Secondary | ICD-10-CM | POA: Diagnosis not present

## 2018-05-10 DIAGNOSIS — H5213 Myopia, bilateral: Secondary | ICD-10-CM | POA: Diagnosis not present

## 2018-06-01 DIAGNOSIS — H5213 Myopia, bilateral: Secondary | ICD-10-CM | POA: Diagnosis not present

## 2019-04-30 ENCOUNTER — Other Ambulatory Visit: Payer: Self-pay

## 2019-04-30 DIAGNOSIS — Z20822 Contact with and (suspected) exposure to covid-19: Secondary | ICD-10-CM

## 2019-05-01 LAB — NOVEL CORONAVIRUS, NAA: SARS-CoV-2, NAA: NOT DETECTED

## 2020-10-16 ENCOUNTER — Other Ambulatory Visit: Payer: Commercial Managed Care - PPO

## 2020-11-10 ENCOUNTER — Other Ambulatory Visit: Payer: Self-pay | Admitting: Obstetrics and Gynecology

## 2020-11-10 DIAGNOSIS — R928 Other abnormal and inconclusive findings on diagnostic imaging of breast: Secondary | ICD-10-CM

## 2020-11-19 ENCOUNTER — Ambulatory Visit
Admission: RE | Admit: 2020-11-19 | Discharge: 2020-11-19 | Disposition: A | Discharge status: Home / Self Care | Payer: 59 | Source: Ambulatory Visit | Attending: Obstetrics and Gynecology | Admitting: Obstetrics and Gynecology

## 2020-11-19 ENCOUNTER — Other Ambulatory Visit: Payer: Self-pay

## 2020-11-19 ENCOUNTER — Ambulatory Visit
Admission: RE | Admit: 2020-11-19 | Discharge: 2020-11-19 | Disposition: A | Discharge status: Home / Self Care | Payer: Commercial Managed Care - PPO | Source: Ambulatory Visit | Attending: Obstetrics and Gynecology | Admitting: Obstetrics and Gynecology

## 2020-11-19 DIAGNOSIS — R928 Other abnormal and inconclusive findings on diagnostic imaging of breast: Secondary | ICD-10-CM

## 2021-09-17 IMAGING — MG MM DIGITAL DIAGNOSTIC UNILAT*L* W/ TOMO W/ CAD
8 series · 8 of 24 positions shown · non-contrast
Comparison: Previous exam(s).

CLINICAL DATA: Possible mass in the outer left breast middle depth
and possible asymmetry in the posterior central left breast in the
oblique projection of a recent screening mammogram.

EXAM:
DIGITAL DIAGNOSTIC UNILATERAL LEFT MAMMOGRAM WITH TOMOSYNTHESIS AND
CAD; ULTRASOUND LEFT BREAST LIMITED
TECHNIQUE: Left digital diagnostic mammography and breast tomosynthesis was
performed. The images were evaluated with computer-aided detection.;
Targeted ultrasound examination of the left breast was performed

[L ML synth-2D]
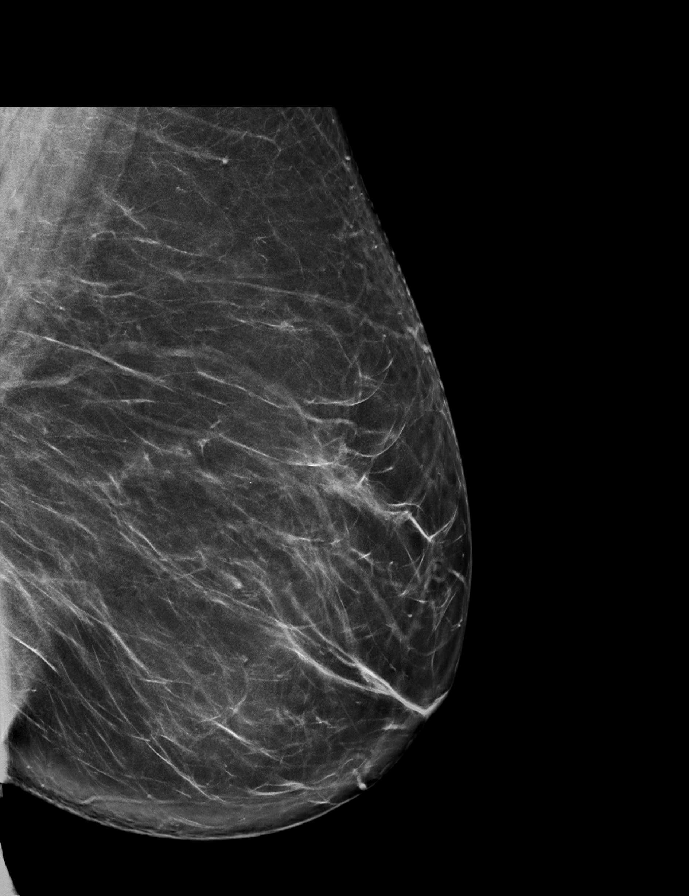

[L CC synth-2D (1 of 2)]
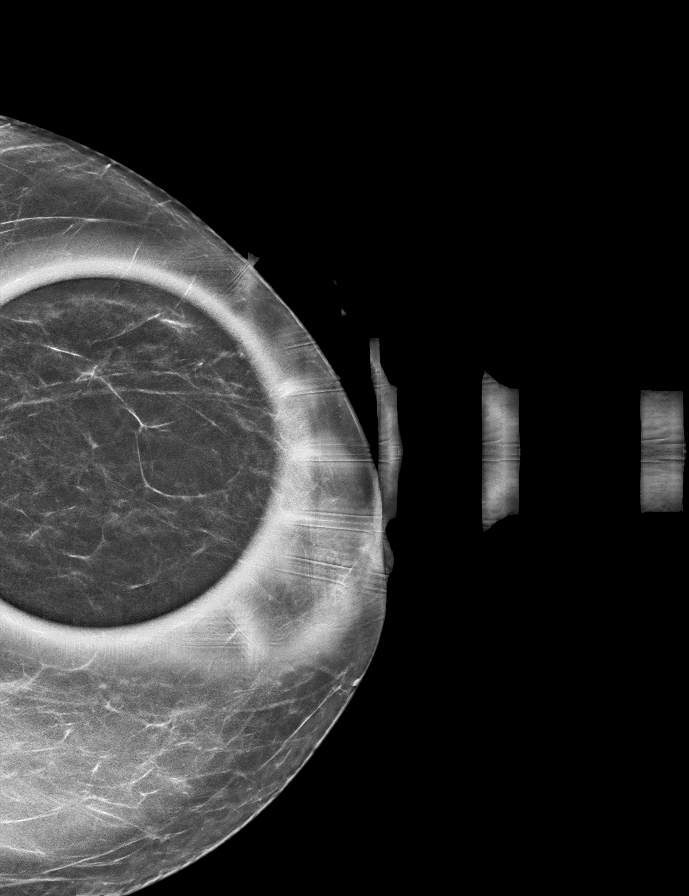

[L CC synth-2D (2 of 2)]
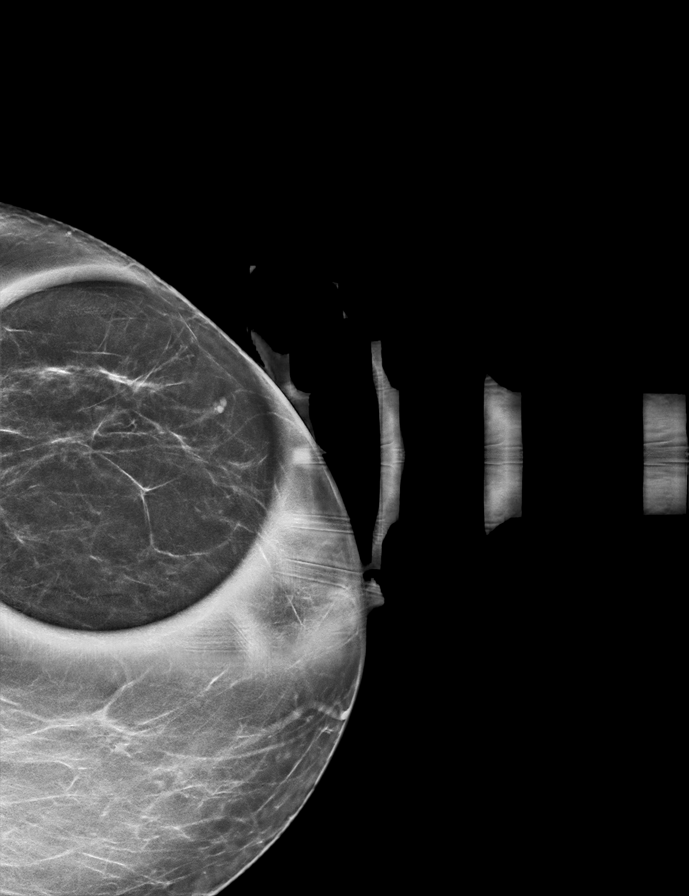

[L MLO synth-2D]
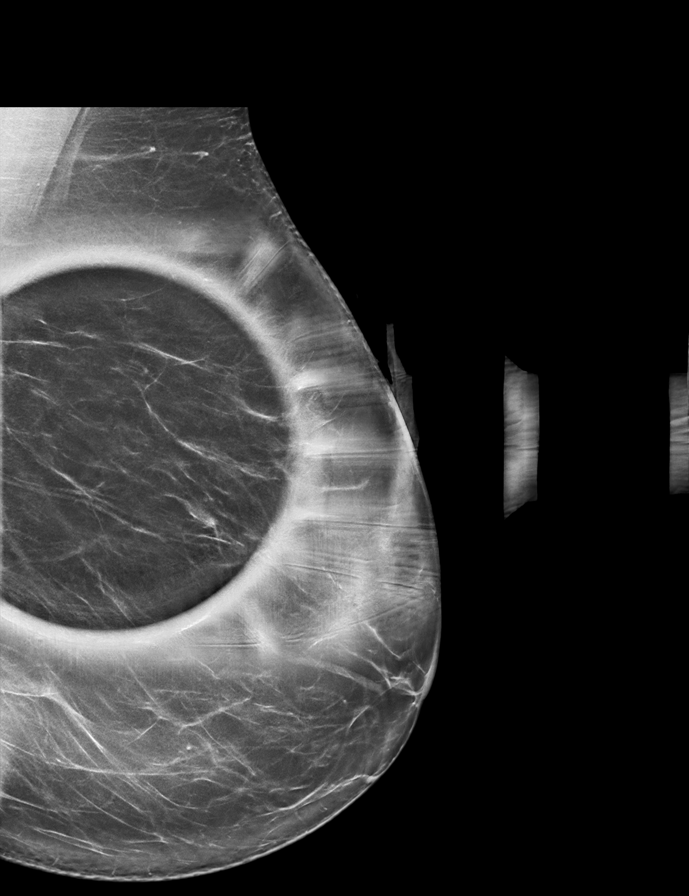

[L CC tomo (1 of 2) · tomo slice 29/57.0]
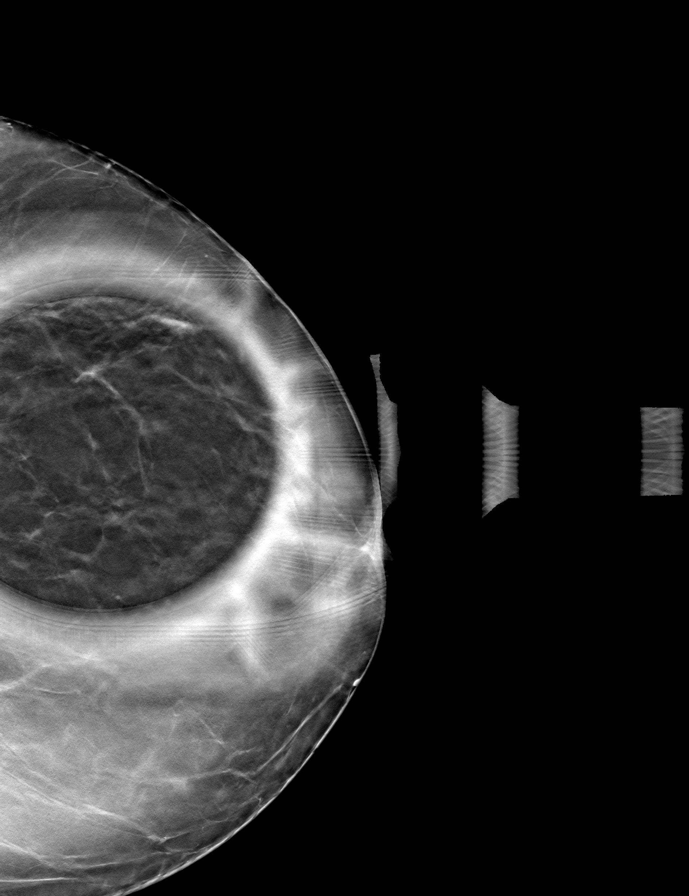

[L MLO tomo · tomo slice 39/77.0]
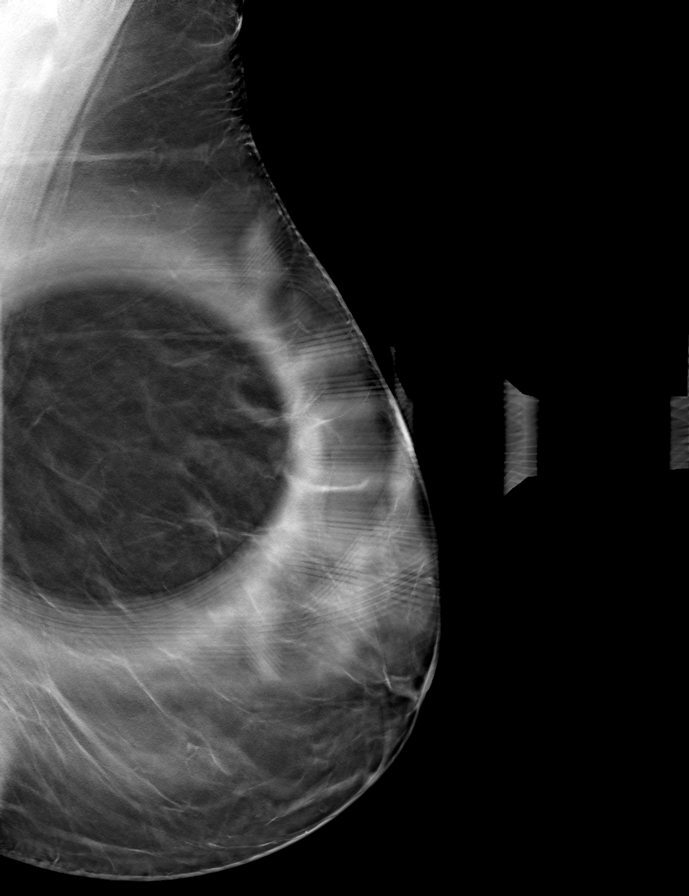

[L CC tomo (2 of 2) · tomo slice 29/58.0]
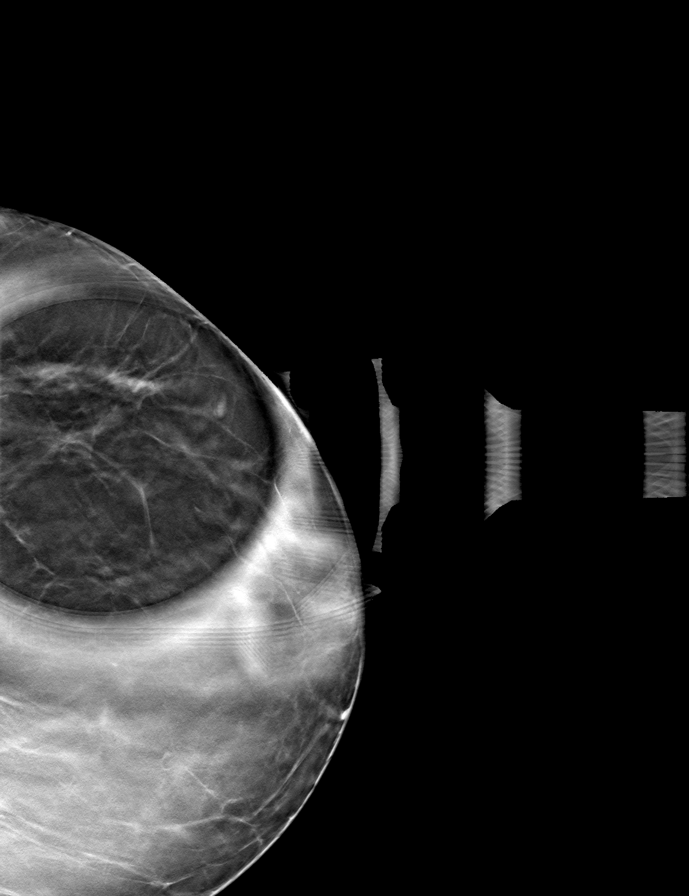

[L ML tomo · tomo slice 41/82.0]
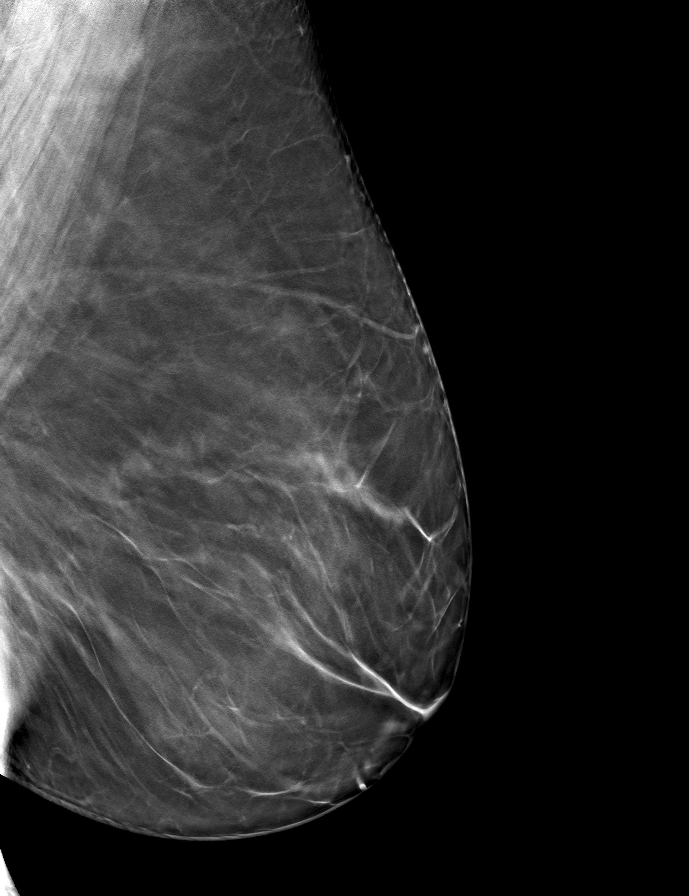

[8 of 24 positions shown; findings below may reference images not displayed]

ACR Breast Density Category b: There are scattered areas of
fibroglandular density.
FINDINGS: A small, oval, circumscribed mass is confirmed in the outer left
breast in approximately the 3 o'clock position, middle depth. Normal
appearing fibroglandular tissue and ligaments are demonstrated in
the posterior central left breast at the location of the recently
suspected asymmetry.

Targeted ultrasound is performed, showing an 8 x 3 x 3 mm normal
appearing intramammary lymph node in the 3 o'clock position of the
left breast, 5 cm from the nipple. This corresponds to the
mammographic mass.
IMPRESSION: 1. No evidence of malignancy.
2. 8 mm normal intramammary lymph node in the 3 o'clock position of
the left breast.
3. The recently suspected left breast asymmetry was close apposition
of normal breast tissue.

RECOMMENDATION:
Bilateral screening mammogram in 1 year when due.

I have discussed the findings and recommendations with the patient.
If applicable, a reminder letter will be sent to the patient
regarding the next appointment.

BI-RADS CATEGORY  2: Benign.

## 2022-04-02 ENCOUNTER — Ambulatory Visit
Admission: RE | Admit: 2022-04-02 | Discharge: 2022-04-02 | Disposition: A | Discharge status: Home / Self Care | Payer: 59 | Source: Ambulatory Visit | Attending: Orthopedic Surgery | Admitting: Orthopedic Surgery

## 2022-04-02 ENCOUNTER — Other Ambulatory Visit: Payer: Self-pay | Admitting: Orthopedic Surgery

## 2022-04-02 DIAGNOSIS — M25562 Pain in left knee: Secondary | ICD-10-CM
